# Patient Record
Sex: Female | Born: 1960 | Race: White | Hispanic: No | Marital: Married
Health system: Southern US, Community
[De-identification: ages and names within clinical notes are randomized; demographics above are authoritative.]

## PROBLEM LIST (undated history)

## (undated) DIAGNOSIS — I729 Aneurysm of unspecified site: Secondary | ICD-10-CM

## (undated) DIAGNOSIS — H04129 Dry eye syndrome of unspecified lacrimal gland: Secondary | ICD-10-CM

## (undated) DIAGNOSIS — F429 Obsessive-compulsive disorder, unspecified: Secondary | ICD-10-CM

## (undated) DIAGNOSIS — F419 Anxiety disorder, unspecified: Secondary | ICD-10-CM

## (undated) DIAGNOSIS — I341 Nonrheumatic mitral (valve) prolapse: Secondary | ICD-10-CM

## (undated) DIAGNOSIS — Z8669 Personal history of other diseases of the nervous system and sense organs: Secondary | ICD-10-CM

## (undated) HISTORY — DX: Dry eye syndrome of unspecified lacrimal gland: H04.129

## (undated) HISTORY — DX: Aneurysm of unspecified site: I72.9

## (undated) HISTORY — DX: Nonrheumatic mitral (valve) prolapse: I34.1

## (undated) HISTORY — PX: TUBAL LIGATION: SHX77

## (undated) HISTORY — DX: Obsessive-compulsive disorder, unspecified: F42.9

## (undated) HISTORY — DX: Anxiety disorder, unspecified: F41.9

## (undated) HISTORY — PX: NASAL SEPTUM SURGERY: SHX37

## (undated) HISTORY — PX: COLONOSCOPY W/ POLYPECTOMY: SHX1380

## (undated) HISTORY — DX: Personal history of other diseases of the nervous system and sense organs: Z86.69

---

## 1986-01-28 HISTORY — PX: MANDIBLE FRACTURE SURGERY: SHX706

## 1996-01-29 HISTORY — PX: NEPHRECTOMY: SHX65

## 1998-04-24 ENCOUNTER — Ambulatory Visit (HOSPITAL_COMMUNITY): Admission: RE | Admit: 1998-04-24 | Discharge: 1998-04-24 | Payer: Self-pay | Admitting: Gastroenterology

## 1998-04-24 ENCOUNTER — Encounter: Payer: Self-pay | Admitting: Gastroenterology

## 1998-04-26 ENCOUNTER — Encounter: Payer: Self-pay | Admitting: Gastroenterology

## 1998-04-26 ENCOUNTER — Ambulatory Visit (HOSPITAL_COMMUNITY): Admission: RE | Admit: 1998-04-26 | Discharge: 1998-04-26 | Payer: Self-pay | Admitting: Gastroenterology

## 1998-07-05 ENCOUNTER — Ambulatory Visit (HOSPITAL_COMMUNITY): Admission: RE | Admit: 1998-07-05 | Discharge: 1998-07-05 | Payer: Self-pay | Admitting: Gastroenterology

## 1999-12-13 ENCOUNTER — Other Ambulatory Visit: Admission: RE | Admit: 1999-12-13 | Discharge: 1999-12-13 | Payer: Self-pay | Admitting: Obstetrics and Gynecology

## 1999-12-17 ENCOUNTER — Encounter: Payer: Self-pay | Admitting: Obstetrics and Gynecology

## 1999-12-17 ENCOUNTER — Ambulatory Visit (HOSPITAL_COMMUNITY): Admission: RE | Admit: 1999-12-17 | Discharge: 1999-12-17 | Payer: Self-pay | Admitting: Obstetrics and Gynecology

## 2000-11-04 ENCOUNTER — Encounter: Admission: RE | Admit: 2000-11-04 | Discharge: 2000-11-04 | Payer: Self-pay | Admitting: Obstetrics and Gynecology

## 2000-11-04 ENCOUNTER — Encounter: Payer: Self-pay | Admitting: Obstetrics and Gynecology

## 2000-11-12 ENCOUNTER — Ambulatory Visit (HOSPITAL_BASED_OUTPATIENT_CLINIC_OR_DEPARTMENT_OTHER): Admission: RE | Admit: 2000-11-12 | Discharge: 2000-11-12 | Payer: Self-pay | Admitting: Otolaryngology

## 2001-09-11 ENCOUNTER — Other Ambulatory Visit: Admission: RE | Admit: 2001-09-11 | Discharge: 2001-09-11 | Payer: Self-pay | Admitting: Obstetrics and Gynecology

## 2002-09-30 ENCOUNTER — Encounter: Admission: RE | Admit: 2002-09-30 | Discharge: 2002-09-30 | Payer: Self-pay | Admitting: Obstetrics and Gynecology

## 2002-09-30 ENCOUNTER — Encounter: Payer: Self-pay | Admitting: Obstetrics and Gynecology

## 2003-03-22 ENCOUNTER — Ambulatory Visit (HOSPITAL_COMMUNITY): Admission: RE | Admit: 2003-03-22 | Discharge: 2003-03-22 | Payer: Self-pay | Admitting: Gastroenterology

## 2003-07-07 ENCOUNTER — Other Ambulatory Visit: Admission: RE | Admit: 2003-07-07 | Discharge: 2003-07-07 | Payer: Self-pay | Admitting: Obstetrics and Gynecology

## 2004-07-12 ENCOUNTER — Other Ambulatory Visit: Admission: RE | Admit: 2004-07-12 | Discharge: 2004-07-12 | Payer: Self-pay | Admitting: Obstetrics and Gynecology

## 2005-04-17 ENCOUNTER — Encounter: Admission: RE | Admit: 2005-04-17 | Discharge: 2005-04-17 | Payer: Self-pay | Admitting: Obstetrics and Gynecology

## 2005-05-08 ENCOUNTER — Encounter: Admission: RE | Admit: 2005-05-08 | Discharge: 2005-05-08 | Payer: Self-pay | Admitting: Obstetrics and Gynecology

## 2006-04-28 ENCOUNTER — Encounter: Admission: RE | Admit: 2006-04-28 | Discharge: 2006-04-28 | Payer: Self-pay | Admitting: Obstetrics and Gynecology

## 2008-03-10 ENCOUNTER — Encounter: Admission: RE | Admit: 2008-03-10 | Discharge: 2008-03-10 | Payer: Self-pay | Admitting: Obstetrics and Gynecology

## 2008-06-28 HISTORY — PX: OTHER SURGICAL HISTORY: SHX169

## 2008-06-30 ENCOUNTER — Ambulatory Visit (HOSPITAL_COMMUNITY): Admission: RE | Admit: 2008-06-30 | Discharge: 2008-06-30 | Payer: Self-pay | Admitting: Obstetrics and Gynecology

## 2008-06-30 ENCOUNTER — Encounter (INDEPENDENT_AMBULATORY_CARE_PROVIDER_SITE_OTHER): Payer: Self-pay | Admitting: Obstetrics and Gynecology

## 2009-03-09 ENCOUNTER — Ambulatory Visit (HOSPITAL_COMMUNITY): Admission: RE | Admit: 2009-03-09 | Discharge: 2009-03-09 | Payer: Self-pay | Admitting: Gastroenterology

## 2009-05-29 ENCOUNTER — Encounter: Admission: RE | Admit: 2009-05-29 | Discharge: 2009-05-29 | Payer: Self-pay | Admitting: Obstetrics and Gynecology

## 2010-01-28 HISTORY — PX: BREAST ENHANCEMENT SURGERY: SHX7

## 2010-04-30 ENCOUNTER — Other Ambulatory Visit: Payer: Self-pay | Admitting: Obstetrics and Gynecology

## 2010-05-07 LAB — BASIC METABOLIC PANEL
BUN: 11 mg/dL (ref 6–23)
CO2: 27 mEq/L (ref 19–32)
Calcium: 9.4 mg/dL (ref 8.4–10.5)
Chloride: 103 mEq/L (ref 96–112)
Creatinine, Ser: 0.77 mg/dL (ref 0.4–1.2)
GFR calc non Af Amer: >60 mL/min (ref >60)
Sodium: 136 mEq/L (ref 135–145)

## 2010-05-07 LAB — CBC
HCT: 41.3 % (ref 36.0–46.0)
MCV: 97.1 fL (ref 78.0–100.0)
RDW: 13.1 % (ref 11.5–15.5)

## 2010-06-12 NOTE — Op Note (Signed)
NAMEJYLL, Brittany Hardy               ACCOUNT NO.:  [id number]   MEDICAL RECORD NO.:  [id number]          PATIENT TYPE:  AMB   LOCATION:  SDC                           FACILITY:  [facility]   PHYSICIAN:  Zenaida Niece, M.D.DATE OF BIRTH:  [birth year]   DATE OF PROCEDURE:  06/30/2008  DATE OF DISCHARGE:                               OPERATIVE REPORT   PREOPERATIVE DIAGNOSIS:  Abnormal uterine bleeding.   POSTOPERATIVE DIAGNOSIS:  Abnormal uterine bleeding.   PROCEDURES:  Hysteroscopy with dilation and curettage and NovaSure  endometrial ablation.   SURGEON:  Zenaida Niece, MD   ANESTHESIA:  General with an LMA and paracervical block.   FINDINGS:  She had a normal endometrial cavity.  The NovaSure device  used to a depth of 4.5 cm, a width of 4.6 cm, and used 114 watts for 2  minutes.   FLUID DEFICIT:  Through the hysteroscope was 40 mL.   SPECIMENS:  Endometrial curettings sent for routine pathology.   ESTIMATED BLOOD LOSS:  Minimal.   COMPLICATIONS:  None.   PROCEDURE IN DETAIL:  The patient was taken to the operating room and  placed in the dorsal supine position.  General anesthesia was induced  with an LMA and she was placed in mobile stirrups.  Perineum and vagina  were then prepped and draped in the usual sterile fashion and bladder  drained with a red Robinson catheter.  A Graves speculum was inserted  into the vagina and the anterior lip of the cervix was grasped with a  single-tooth tenaculum.  Deep paracervical block was then performed with  a total of 16 mL of 2% plain lidocaine.  Uterus sounded to 9 cm.  The  cervix was gradually easily dilated to accommodate a size 17 dilator.  The observer hysteroscope was inserted and good visualization was  achieved.  There were no obvious polyps or myomas, although there was a  fair amount of endometrial tissue.  The hysteroscope was removed.  The  cervix was further dilated to accommodate a size 7 Hegar dilator which  measured the cervix of 4.5 cm.  Cervix was then dilated to a size 8  Hegar dilator.  Sharp curettage was then performed with return of a  moderate amount of tissue.  Hysteroscopy was again performed which  revealed no significant endometrial tissue left and no obvious  endometrial lesions.  Fluid was evacuated and hysteroscope was removed.  The cervix required further dilation to be able to insert the NovaSure  device as it kept getting caught on the cervix.  However, the device was  inserted and deployed appropriately.  CO2 test passed.  Endometrial  ablation was performed with the above-mentioned settings without  complications.  The ablation did run the full 2 minutes of the cycle.  The NovaSure was allowed to cool and was then removed intact.  Hysteroscopy was again performed and revealed good global endometrial  ablation.  Polyp forceps were then used to remove some ablated pieces of  endometrial tissue.  The single-tooth tenaculum was removed and bleeding  was controlled with  pressure.  All  instruments were then removed from the vagina.  The  patient tolerated the procedure well.  She was awakened in the operating  room and taken to the recovery room in stable condition.  Counts were  correct, she had PAS hose on throughout the procedure.  She received  Toradol at the beginning of the procedure and no antibiotics.       Zenaida Niece, M.D.  Electronically Signed     TDM/MEDQ  D:  06/30/2008  T:  06/30/2008  Job:  [id number]

## 2010-06-15 NOTE — Op Note (Signed)
[place]  Patient:    Brittany, Hardy Visit Number: [id number] MRN: [id number]          Service Type: DSU Location: [facility] Attending Physician:  Susy Frizzle Dictated by:   Jeannett Senior Pollyann Kennedy, M.D. Proc. Date: 11/12/00 Admit Date:  11/12/2000                             Operative Report  PREOPERATIVE DIAGNOSIS:  Deviated septum, turbinate hypertrophy.  POSTOPERATIVE DIAGNOSIS:  Deviated septum, turbinate hypertrophy.  OPERATION PERFORMED: 1. Nasal septoplasty. 2. Submucous resection of inferior turbinates bilaterally.  SURGEON:  Jefry H. Pollyann Kennedy, M.D.  ANESTHESIA:  General endotracheal was used.  COMPLICATIONS:  None.  ESTIMATED BLOOD LOSS:  20 cc.  OPERATIVE FINDINGS:  Significant deviation of the posterior septum, the bony septum toward the left side with bilateral spurs, significant deviation of the caudal septum toward the right side with resulting deviation of the nasal tip toward the right as well.  Significant bony enlargement of the inferior turbinates bilaterally.  The patient tolerated the procedure well, was awakened, extubated and transferred to recovery in stable condition.  INDICATIONS FOR PROCEDURE:  The patient is a 50 year old lady with a chronic history of recurring sinusitis and chronic nasal obstruction.  The risks, benefits, alternatives and complications of the procedure were explained to the patient, who seemed to understand and agreed to surgery.  DESCRIPTION OF PROCEDURE:  The patient was taken to the operating room and placed on the operating table in the supine position.  Following induction of general endotracheal anesthesia, the patient was prepped and draped in a standard fashion.  Oxymetazoline spray was used preoperatively in the nose. 1% Xylocaine with epinephrine was infiltrated into the septum, the columella and the inferior turbinates bilaterally.  A total of about 4.5 cc was used. Afrin-soaked pledgets  were then placed in the nasal cavities bilaterally.  1 - Nasal septoplasty.  A left hemitransfixion incision was created using a 15 scalpel.  Mucoperichondrial flap was developed posteriorly down the left side. There was a linear tear in the inferior aspect where there was a significant bend in the septum.  The flap was developed posteriorly all the way to the sphenoid rostrum.  The bony cartilaginous junction was divided and the flap was developed down the right side.  There were no mucosal tears on the right side.  The superior attachment of the ethmoid perpendicular plate was taken down with a Jansen-Middleton rongeur.  The inferior and posterior attachments were resected as well and large fragments of ethmoid plate were resected. There was significant deviation of the ethmoid plate toward the left side. This freed up the posterior obstruction.  The caudal septum was then evaluated.  The quadrangular cartilage was elongated and draped over the left side of the maxillary crest which was also elongated and deflected to the left and causing a very large spur down the inferior meatus airway on the left side.  The mucosa was elevated.  The cartilage was detached from the maxillary crest bone.  The maxillary crest was taken down with a 4 mm osteotome.  About 2.5 to 3 mm of inferior quadrangular cartilage were resected.  The caudal limit of the cartilage was then dissected out of the pocket as soft tissue in the membranous septum.  Midline positioning was assured and then it was secured down to the soft tissue of the nasal spine using a single 4-0  undyed nylon suture.  This resulted in nice midline positioning and good stability of the nasal septal cartilage. There was still good tip support remaining.  It seemed that the tip was lying more midline after this maneuver as well.  The incision was reapproximated with 4-0 chromic suture.  The septal flaps were quilted with plain gut.  Septal  splints were created with Silastic sheeting and secured in place with a nylon suture.  2 - Submucous resection of inferior turbinates bilaterally.  The leading edge of the inferior turbinates was incised in a vertical fashion with a 15 scalpel.  Freer elevator was used to elevate mucosa off the turbinate bone laterally, medially and inferiorly.  Large fragments of turbinate bone were resected.  The remnants were then outfractured with a Therapist, nutritional.  Nasal cavities were suctioned of blood and secretions and packed with rolled up Telfa gauze, coated with bacitracin ointment.  The pharynx was suctioned of blood and secretions.  The patient was then awakened, extubated and transferred to recovery in good condition. Dictated by:   Jeannett Senior Pollyann Kennedy, M.D. Attending Physician:  Susy Frizzle DD:  11/12/00 TD:  11/13/00 Job: [id number]

## 2010-10-12 ENCOUNTER — Other Ambulatory Visit: Payer: Self-pay | Admitting: Obstetrics and Gynecology

## 2010-10-12 DIAGNOSIS — Z1231 Encounter for screening mammogram for malignant neoplasm of breast: Secondary | ICD-10-CM

## 2010-10-17 ENCOUNTER — Ambulatory Visit
Admission: RE | Admit: 2010-10-17 | Discharge: 2010-10-17 | Disposition: A | Discharge status: Home / Self Care | Payer: Self-pay | Source: Ambulatory Visit | Attending: Obstetrics and Gynecology | Admitting: Obstetrics and Gynecology

## 2010-10-17 DIAGNOSIS — Z1231 Encounter for screening mammogram for malignant neoplasm of breast: Secondary | ICD-10-CM

## 2011-10-07 ENCOUNTER — Other Ambulatory Visit: Payer: Self-pay | Admitting: Obstetrics and Gynecology

## 2011-10-07 DIAGNOSIS — Z1231 Encounter for screening mammogram for malignant neoplasm of breast: Secondary | ICD-10-CM

## 2011-10-22 ENCOUNTER — Ambulatory Visit
Admission: RE | Admit: 2011-10-22 | Discharge: 2011-10-22 | Disposition: A | Discharge status: Home / Self Care | Payer: BC Managed Care – PPO | Source: Ambulatory Visit | Attending: Obstetrics and Gynecology | Admitting: Obstetrics and Gynecology

## 2011-10-22 DIAGNOSIS — Z1231 Encounter for screening mammogram for malignant neoplasm of breast: Secondary | ICD-10-CM

## 2012-10-07 ENCOUNTER — Other Ambulatory Visit: Payer: Self-pay

## 2012-10-07 DIAGNOSIS — Z1231 Encounter for screening mammogram for malignant neoplasm of breast: Secondary | ICD-10-CM

## 2012-10-07 DIAGNOSIS — Z9882 Breast implant status: Secondary | ICD-10-CM

## 2012-10-23 ENCOUNTER — Ambulatory Visit
Admission: RE | Admit: 2012-10-23 | Discharge: 2012-10-23 | Disposition: A | Discharge status: Home / Self Care | Payer: BC Managed Care – PPO | Source: Ambulatory Visit

## 2012-10-23 DIAGNOSIS — Z9882 Breast implant status: Secondary | ICD-10-CM

## 2012-10-23 DIAGNOSIS — Z1231 Encounter for screening mammogram for malignant neoplasm of breast: Secondary | ICD-10-CM

## 2013-03-05 ENCOUNTER — Other Ambulatory Visit: Payer: Self-pay

## 2013-03-05 ENCOUNTER — Ambulatory Visit (HOSPITAL_COMMUNITY): Payer: BC Managed Care – PPO | Attending: Cardiovascular Disease | Admitting: Radiology

## 2013-03-05 ENCOUNTER — Other Ambulatory Visit (HOSPITAL_COMMUNITY): Payer: Self-pay | Admitting: Internal Medicine

## 2013-03-05 ENCOUNTER — Encounter: Payer: Self-pay | Admitting: Cardiovascular Disease

## 2013-03-05 DIAGNOSIS — I339 Acute and subacute endocarditis, unspecified: Secondary | ICD-10-CM | POA: Insufficient documentation

## 2013-03-05 DIAGNOSIS — I059 Rheumatic mitral valve disease, unspecified: Secondary | ICD-10-CM

## 2013-03-05 DIAGNOSIS — Z8249 Family history of ischemic heart disease and other diseases of the circulatory system: Secondary | ICD-10-CM | POA: Insufficient documentation

## 2013-03-05 NOTE — Progress Notes (Signed)
Echocardiogram performed.  

## 2013-09-18 ENCOUNTER — Encounter: Payer: Self-pay | Admitting: *Deleted

## 2013-10-22 ENCOUNTER — Other Ambulatory Visit: Payer: Self-pay

## 2013-10-22 DIAGNOSIS — Z1231 Encounter for screening mammogram for malignant neoplasm of breast: Secondary | ICD-10-CM

## 2013-11-09 ENCOUNTER — Ambulatory Visit
Admission: RE | Admit: 2013-11-09 | Discharge: 2013-11-09 | Disposition: A | Discharge status: Home / Self Care | Payer: BC Managed Care – PPO | Source: Ambulatory Visit

## 2013-11-09 DIAGNOSIS — Z1231 Encounter for screening mammogram for malignant neoplasm of breast: Secondary | ICD-10-CM

## 2014-10-26 ENCOUNTER — Other Ambulatory Visit: Payer: Self-pay

## 2014-10-26 DIAGNOSIS — Z1231 Encounter for screening mammogram for malignant neoplasm of breast: Secondary | ICD-10-CM

## 2014-11-14 ENCOUNTER — Ambulatory Visit
Admission: RE | Admit: 2014-11-14 | Discharge: 2014-11-14 | Disposition: A | Discharge status: Home / Self Care | Payer: BLUE CROSS/BLUE SHIELD | Source: Ambulatory Visit

## 2014-11-14 DIAGNOSIS — Z1231 Encounter for screening mammogram for malignant neoplasm of breast: Secondary | ICD-10-CM

## 2015-11-24 ENCOUNTER — Other Ambulatory Visit: Payer: Self-pay | Admitting: Obstetrics and Gynecology

## 2015-11-24 DIAGNOSIS — Z0000 Encounter for general adult medical examination without abnormal findings: Secondary | ICD-10-CM

## 2015-12-27 ENCOUNTER — Ambulatory Visit
Admission: RE | Admit: 2015-12-27 | Discharge: 2015-12-27 | Disposition: A | Discharge status: Home / Self Care | Payer: BLUE CROSS/BLUE SHIELD | Source: Ambulatory Visit | Attending: Obstetrics and Gynecology | Admitting: Obstetrics and Gynecology

## 2015-12-27 DIAGNOSIS — Z0000 Encounter for general adult medical examination without abnormal findings: Secondary | ICD-10-CM

## 2019-04-28 ENCOUNTER — Other Ambulatory Visit: Payer: Self-pay | Admitting: Obstetrics and Gynecology

## 2019-04-28 DIAGNOSIS — R928 Other abnormal and inconclusive findings on diagnostic imaging of breast: Secondary | ICD-10-CM

## 2019-05-03 ENCOUNTER — Other Ambulatory Visit: Payer: Self-pay | Admitting: Obstetrics and Gynecology

## 2019-05-03 ENCOUNTER — Ambulatory Visit
Admission: RE | Admit: 2019-05-03 | Discharge: 2019-05-03 | Disposition: A | Discharge status: Home / Self Care | Payer: 59 | Source: Ambulatory Visit | Attending: Obstetrics and Gynecology | Admitting: Obstetrics and Gynecology

## 2019-05-03 ENCOUNTER — Ambulatory Visit
Admission: RE | Admit: 2019-05-03 | Discharge: 2019-05-03 | Disposition: A | Discharge status: Home / Self Care | Payer: BLUE CROSS/BLUE SHIELD | Source: Ambulatory Visit | Attending: Obstetrics and Gynecology | Admitting: Obstetrics and Gynecology

## 2019-05-03 ENCOUNTER — Other Ambulatory Visit: Payer: Self-pay

## 2019-05-03 DIAGNOSIS — N6320 Unspecified lump in the left breast, unspecified quadrant: Secondary | ICD-10-CM

## 2019-05-03 DIAGNOSIS — R928 Other abnormal and inconclusive findings on diagnostic imaging of breast: Secondary | ICD-10-CM

## 2019-05-07 ENCOUNTER — Ambulatory Visit
Admission: RE | Admit: 2019-05-07 | Discharge: 2019-05-07 | Disposition: A | Discharge status: Home / Self Care | Payer: 59 | Source: Ambulatory Visit | Attending: Obstetrics and Gynecology | Admitting: Obstetrics and Gynecology

## 2019-05-07 ENCOUNTER — Other Ambulatory Visit: Payer: Self-pay

## 2019-05-07 DIAGNOSIS — N6320 Unspecified lump in the left breast, unspecified quadrant: Secondary | ICD-10-CM

## 2020-04-28 IMAGING — MG MM DIGITAL DIAGNOSTIC UNILAT*L* IMPLANT W/ TOMO W/ CAD
6 series · 6 of 18 positions shown · non-contrast
Comparison: Previous exam(s).

CLINICAL DATA: Recall from 2D screening mammography with
tomosynthesis, mass involving the UPPER OUTER QUADRANT of the LEFT
breast which is palpable. The patient has indwelling retropectoral
implants.

EXAM:
DIGITAL DIAGNOSTIC LEFT MAMMOGRAM WITH IMPLANTS, CAD AND TOMO
ULTRASOUND LEFT BREAST

[L MLO synth-2D]
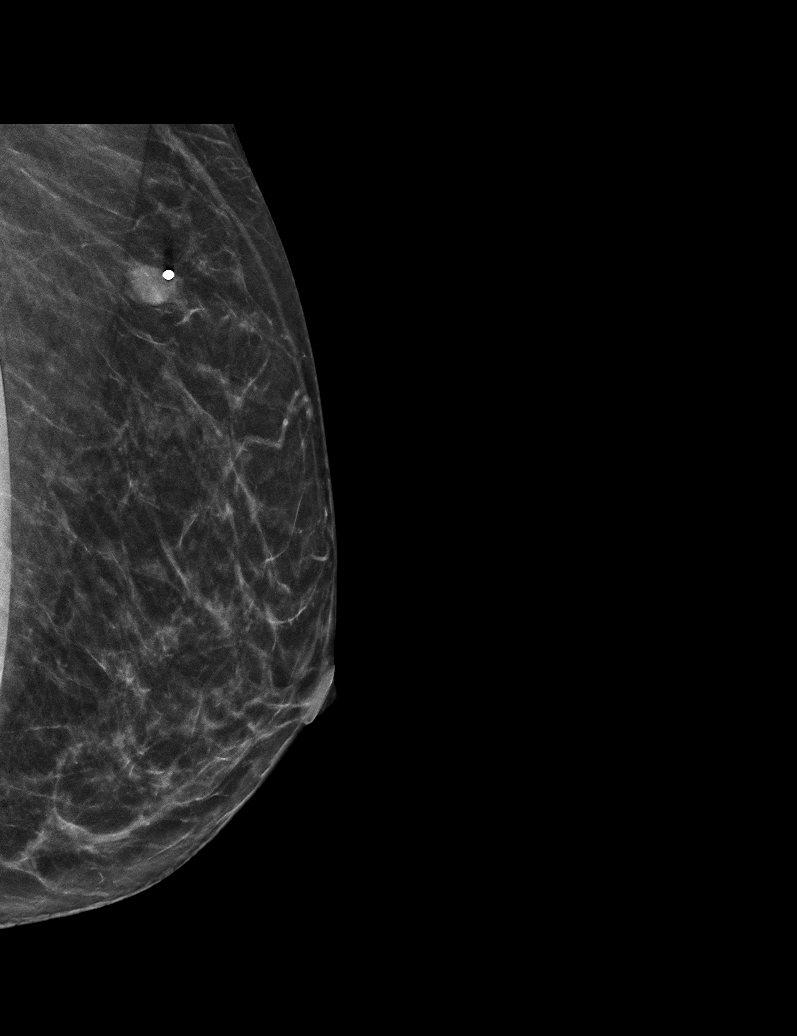

[L TAN synth-2D]
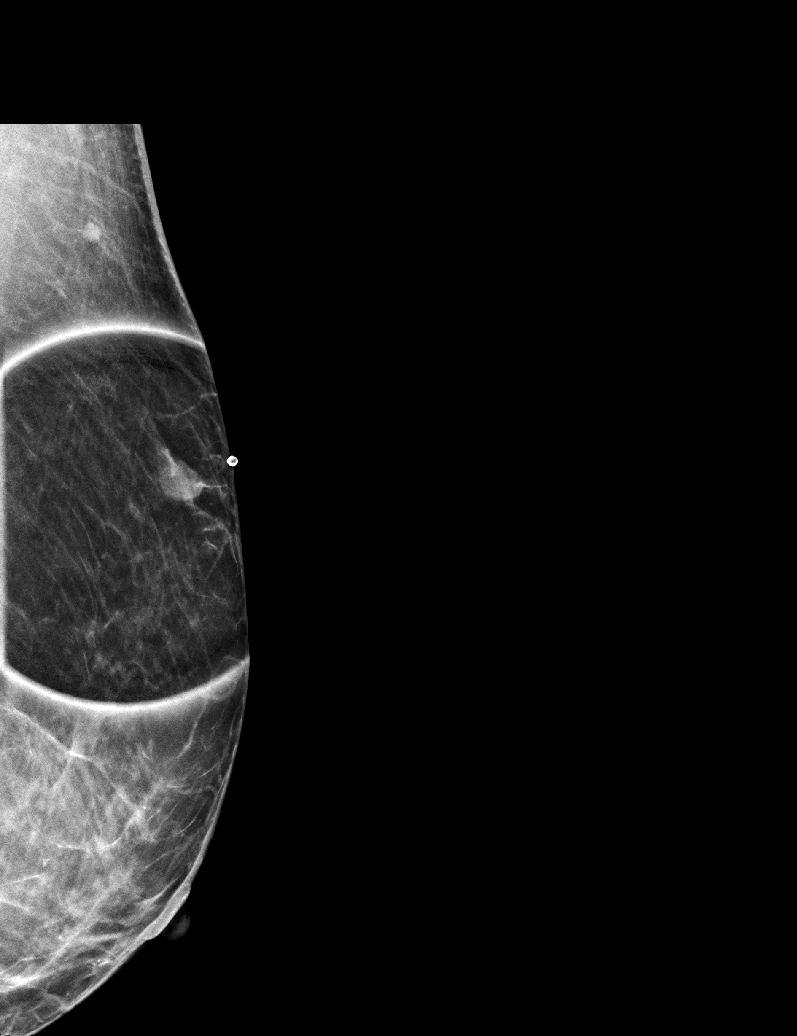

[L CC synth-2D]
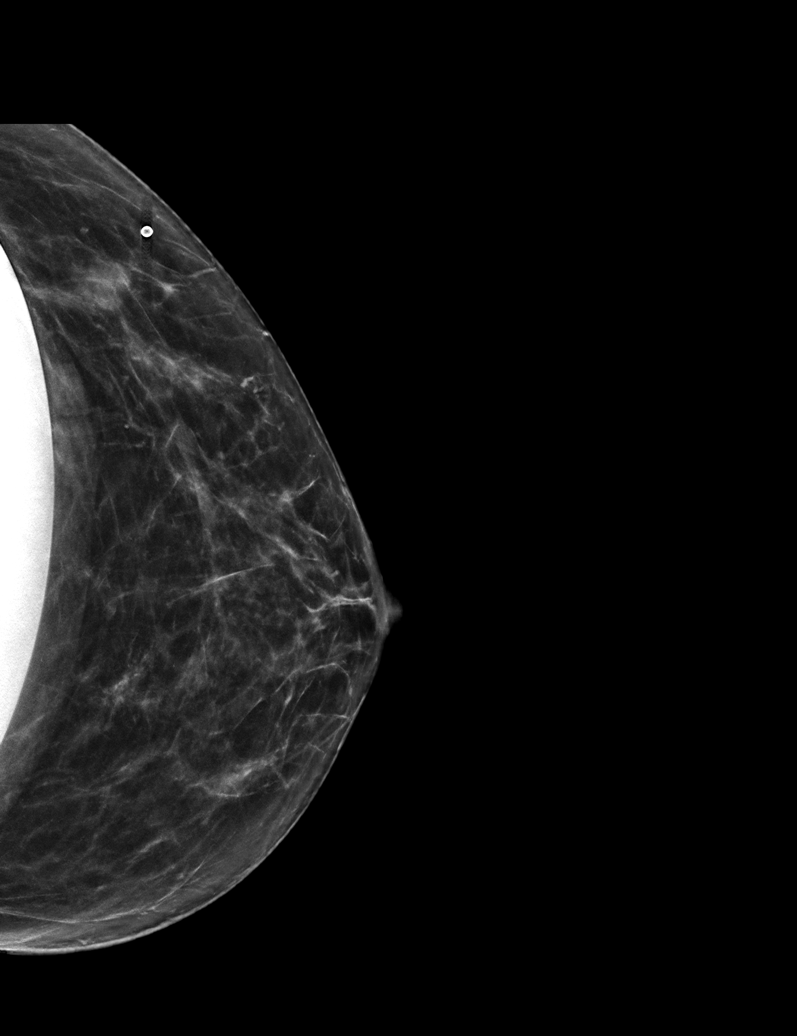

[L MLOID BREAST TOMOSYNTHESIS IMAGE tomo · tomo slice 23/46.0]
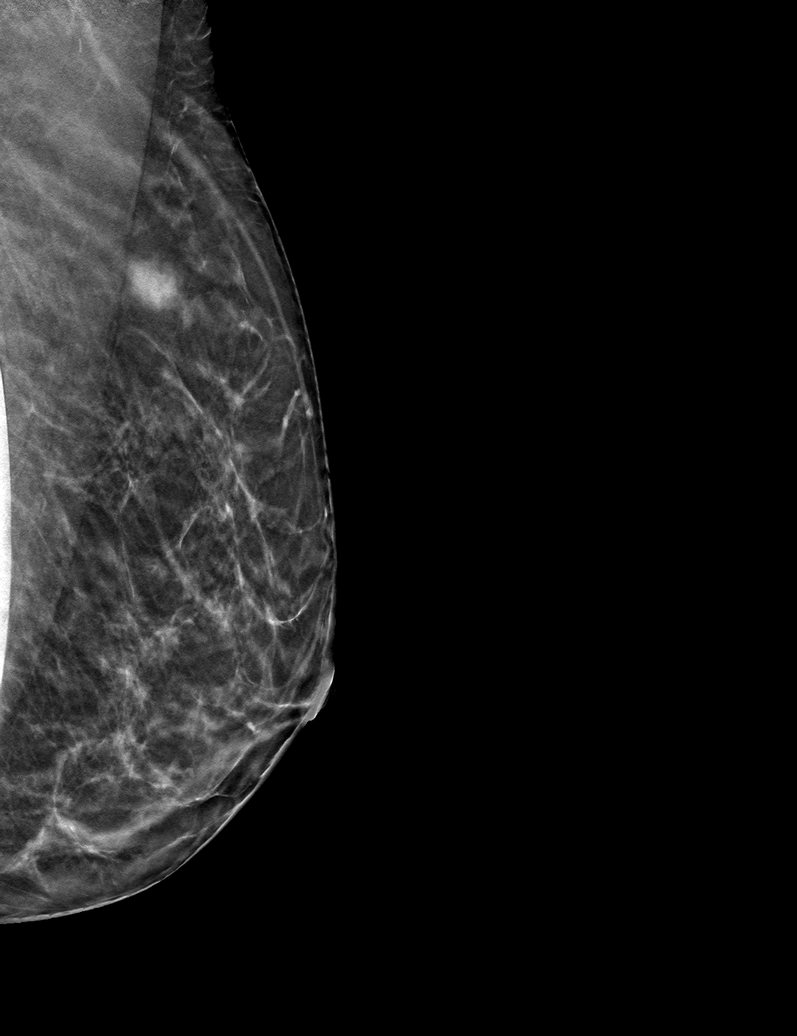

[L TAN tomo · tomo slice 21/40.0]
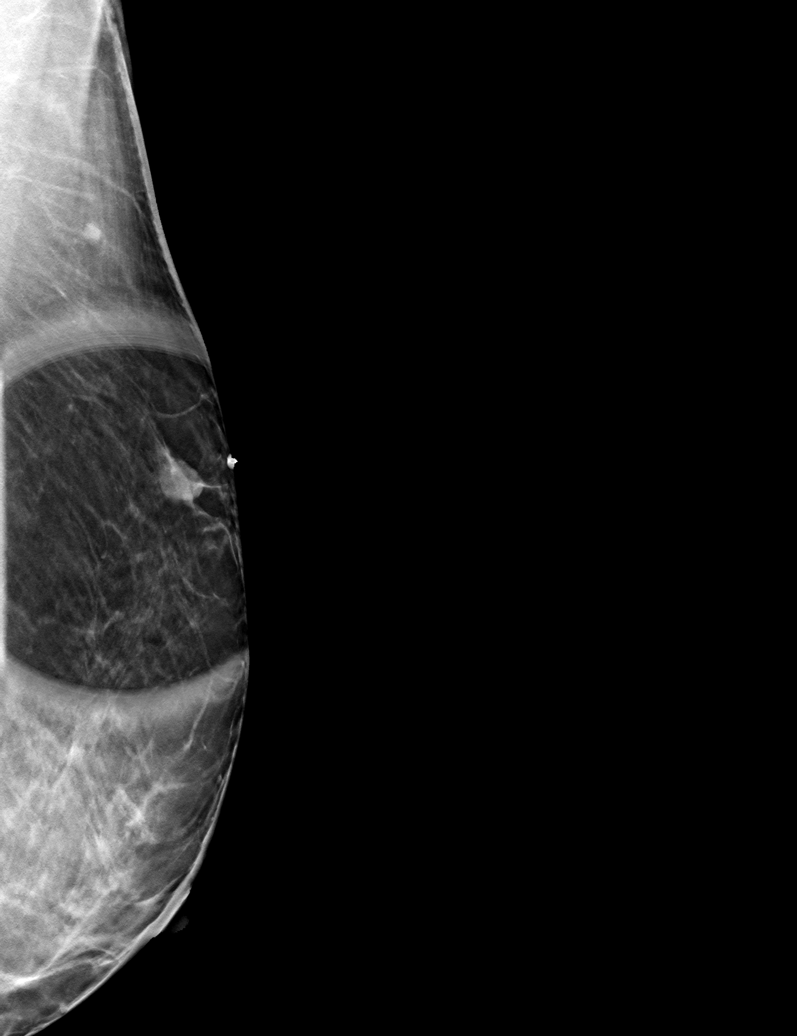

[L CCID BREAST TOMOSYNTHESIS IMAGE tomo · tomo slice 27/52.0]
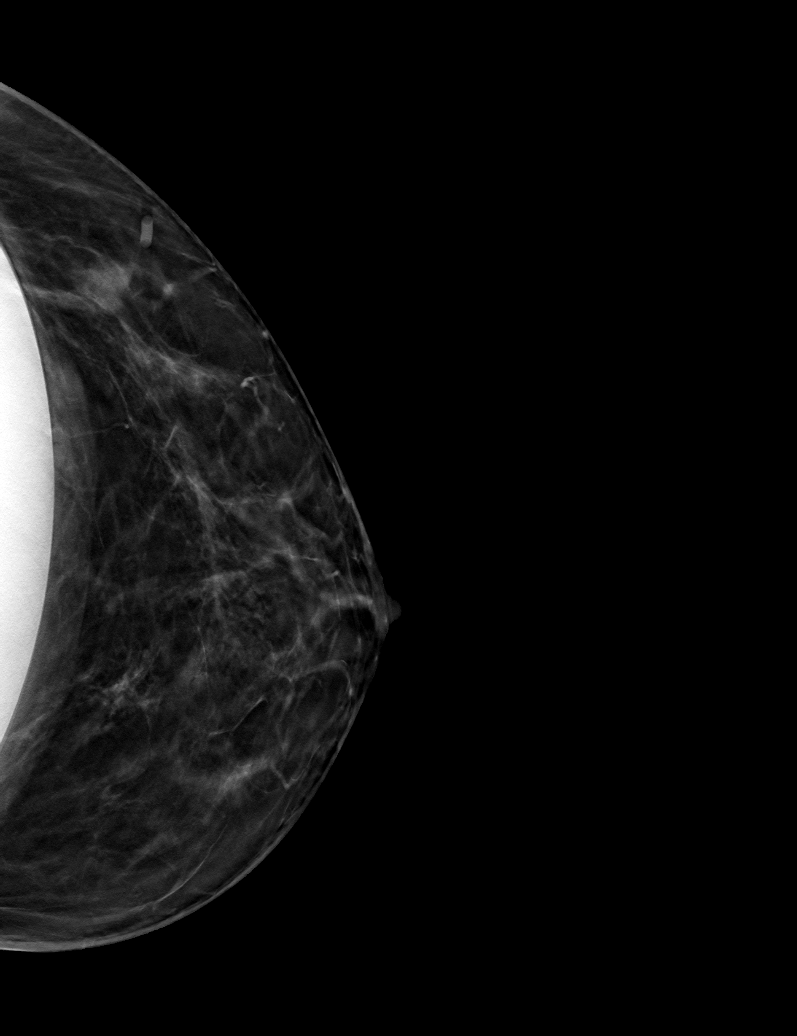

[6 of 18 positions shown; findings below may reference images not displayed]

ACR Breast Density Category b: There are scattered areas of
fibroglandular density.
FINDINGS: Tomosynthesis and synthesized implant displaced CC and MLO views of
the LEFT breast and a tomosynthesis and synthesized spot tangential
view of the palpable concern in the LEFT breast were obtained.

These images demonstrate and isoechoic to hyperechoic mass with
vague POSTERIOR margins in the UPPER OUTER QUADRANT at POSTERIOR
depth measuring approximately 1 cm in size. There is no associated
architectural distortion or suspicious calcifications. No suspicious
findings elsewhere in the LEFT breast.

Mammographic images were processed with CAD.

On correlative physical exam, there is a palpable approximate 1 cm
mass in the UPPER OUTER QUADRANT of the LEFT breast.

Targeted LEFT breast ultrasound is performed, showing a hypoechoic
mass with irregular margins at the 1 o'clock position approximately
7 cm from nipple measuring approximately 0.7 x 1.1 x 1.0 cm,
demonstrating posterior acoustic enhancement and demonstrating
internal power Doppler flow, corresponding to the palpable concern
and mammographic finding.

Sonographic evaluation of the LEFT axilla demonstrates no pathologic
lymphadenopathy.
IMPRESSION: 1. Highly suspicious approximate 1.1 cm mass involving the UPPER
OUTER QUADRANT of the LEFT breast at the 1 o'clock position
approximately 7 cm from the nipple.
2. No pathologic LEFT axillary lymphadenopathy.

RECOMMENDATION:
Ultrasound-guided core needle biopsy of the LEFT breast mass.

The ultrasound biopsy procedure was discussed with the patient and
her questions were answered. She wishes to proceed and the biopsy
has been scheduled at her convenience.

I have discussed the findings and recommendations with the patient.

BI-RADS CATEGORY  5: Highly suggestive of malignancy.
